# Patient Record
Sex: Female | Born: 1995 | Race: White | Hispanic: No | Marital: Single
Health system: Southern US, Community
[De-identification: ages and names within clinical notes are randomized; demographics above are authoritative.]

---
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
## 2022-11-18 DIAGNOSIS — Z419 Encounter for procedure for purposes other than remedying health state, unspecified: Secondary | ICD-10-CM | POA: Diagnosis not present

## 2022-12-19 DIAGNOSIS — Z419 Encounter for procedure for purposes other than remedying health state, unspecified: Secondary | ICD-10-CM | POA: Diagnosis not present
